# Patient Record
Sex: Male | Born: 1980 | Race: Asian | Hispanic: No | Marital: Married
Health system: Southern US, Community
[De-identification: ages and names within clinical notes are randomized; demographics above are authoritative.]

---

## 2006-08-02 ENCOUNTER — Emergency Department (HOSPITAL_COMMUNITY): Admission: EM | Admit: 2006-08-02 | Discharge: 2006-08-02 | Payer: Self-pay | Admitting: Emergency Medicine

## 2007-04-01 ENCOUNTER — Emergency Department (HOSPITAL_COMMUNITY): Admission: EM | Admit: 2007-04-01 | Discharge: 2007-04-02 | Payer: Self-pay | Admitting: Emergency Medicine

## 2009-08-31 ENCOUNTER — Emergency Department (HOSPITAL_COMMUNITY): Admission: EM | Admit: 2009-08-31 | Discharge: 2009-08-31 | Payer: Self-pay | Admitting: Emergency Medicine

## 2010-02-24 ENCOUNTER — Emergency Department (HOSPITAL_COMMUNITY)
Admission: EM | Admit: 2010-02-24 | Discharge: 2010-02-24 | Payer: Self-pay | Source: Home / Self Care | Admitting: Emergency Medicine

## 2010-02-24 LAB — URINALYSIS, ROUTINE W REFLEX MICROSCOPIC
Bilirubin Urine: NEGATIVE
Hgb urine dipstick: NEGATIVE
Ketones, ur: NEGATIVE mg/dL
Nitrite: NEGATIVE
Protein, ur: NEGATIVE mg/dL
Specific Gravity, Urine: 1.009 (ref 1.005–1.030)
Urine Glucose, Fasting: NEGATIVE mg/dL
Urobilinogen, UA: 0.2 mg/dL (ref 0.0–1.0)
pH: 6 (ref 5.0–8.0)

## 2010-10-22 LAB — I-STAT 8, (EC8 V) (CONVERTED LAB)
BUN: 7
Chloride: 110
HCT: 49
Potassium: 3.7
Sodium: 144
TCO2: 24
pCO2, Ven: 43.3 — ABNORMAL LOW

## 2010-10-22 LAB — CBC
HCT: 44.7
MCHC: 34.5
Platelets: 235
RBC: 5.27
RDW: 12.8
WBC: 13 — ABNORMAL HIGH

## 2010-10-22 LAB — POCT I-STAT CREATININE
Creatinine, Ser: 1
Operator id: 294341

## 2010-10-22 LAB — DIFFERENTIAL
Basophils Absolute: 0.1
Eosinophils Absolute: 1.7 — ABNORMAL HIGH
Eosinophils Relative: 13 — ABNORMAL HIGH
Lymphs Abs: 4.7 — ABNORMAL HIGH
Monocytes Absolute: 0.9
Neutro Abs: 5.7
Neutrophils Relative %: 44

## 2010-10-22 LAB — ETHANOL: Alcohol, Ethyl (B): 305 — ABNORMAL HIGH

## 2011-11-18 IMAGING — CT CT ABD-PELV W/O CM
2 of 7 series · 15 of 46 positions shown, 19 images · non-contrast
Comparison: None.

CLINICAL DATA: Left flank pain.

CT ABDOMEN AND PELVIS WITHOUT CONTRAST
TECHNIQUE: Multidetector CT imaging of the abdomen and pelvis was
performed following the standard protocol without intravenous
contrast.

[Series 2: a/p w/o 5.0 b31f st · axial · non-contrast · 0.76mm/px · z∈[-376,+38]mm · 12 of 93 slices shown, 16 images]
[im 5/93  soft-tissue]
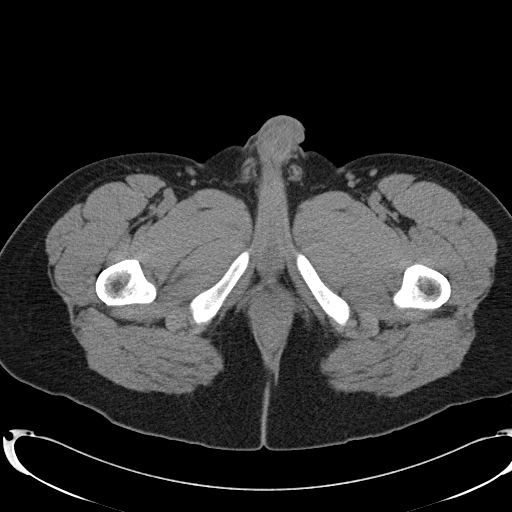
[im 5/93  bone]
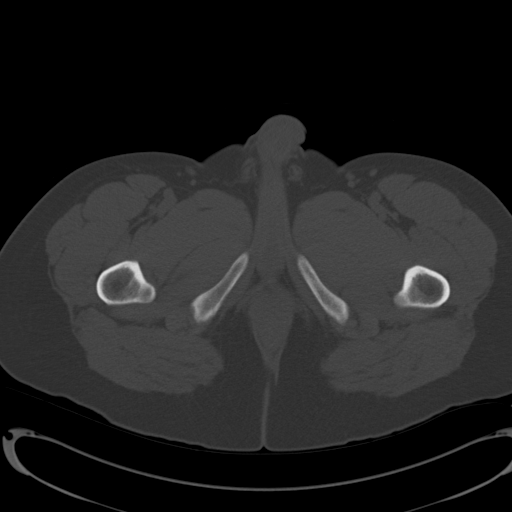
[im 15/93  soft-tissue]
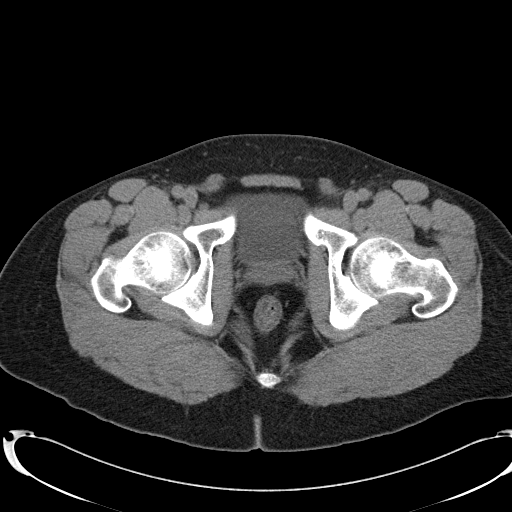
[im 25/93  soft-tissue]
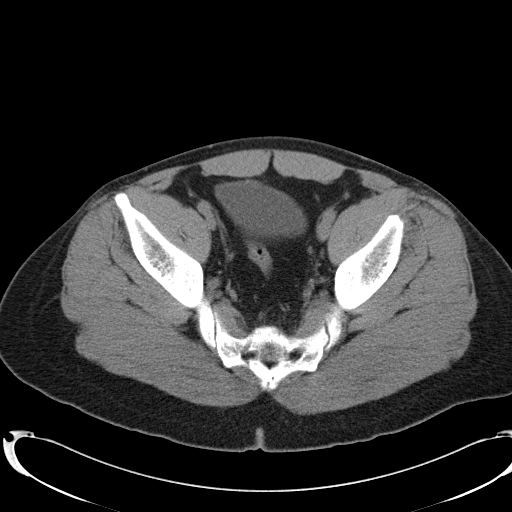
[im 34/93  soft-tissue]
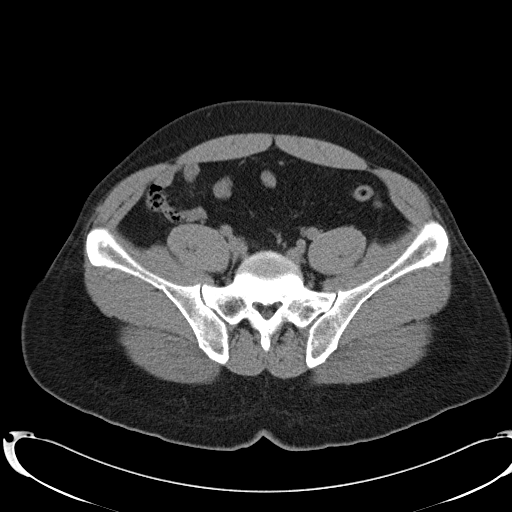
[im 44/93  soft-tissue]
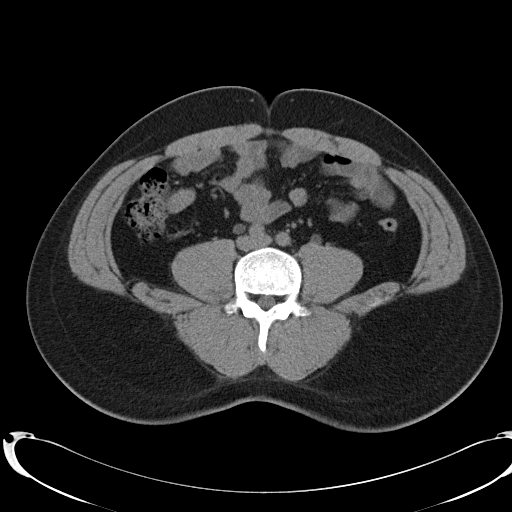
[im 49/93  soft-tissue]
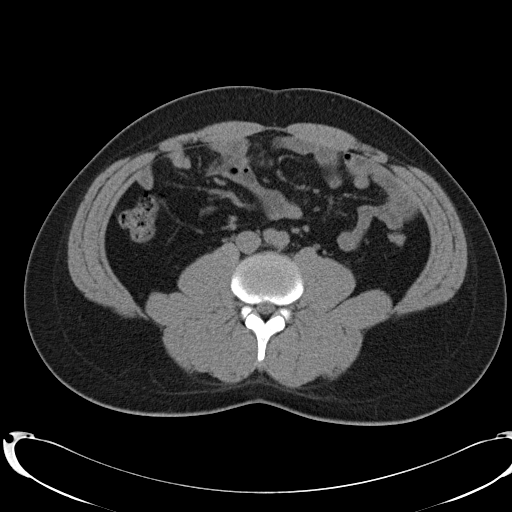
[im 59/93  soft-tissue]
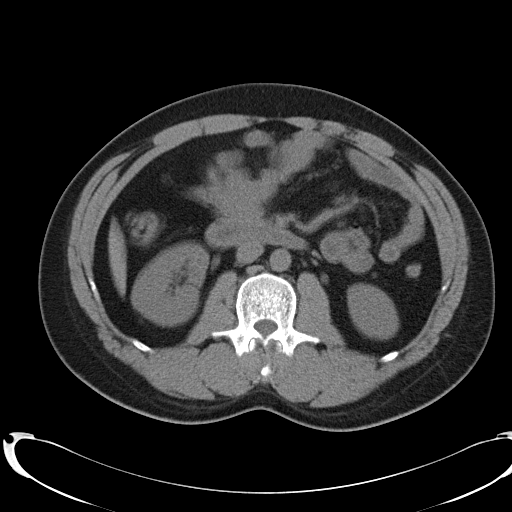
[im 68/93  soft-tissue]
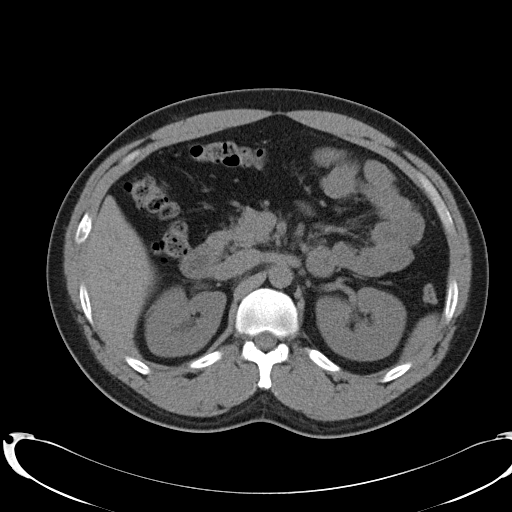
[im 73/93  lung]
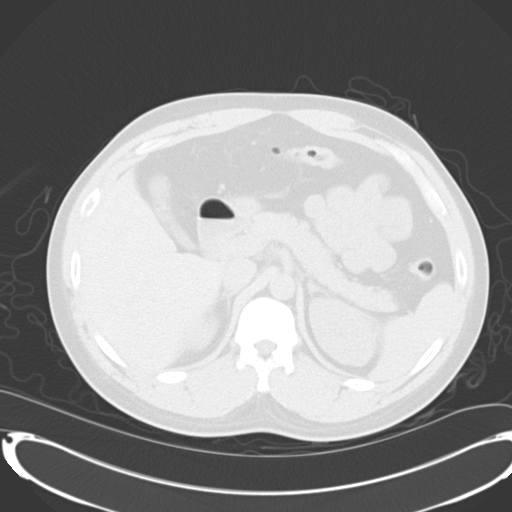
[im 78/93  soft-tissue]
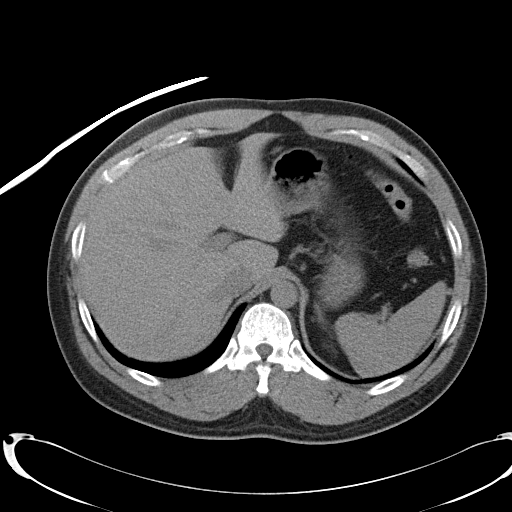
[im 78/93  lung]
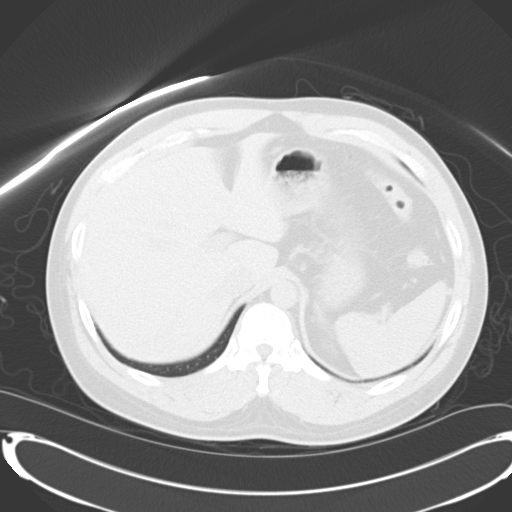
[im 78/93  bone]
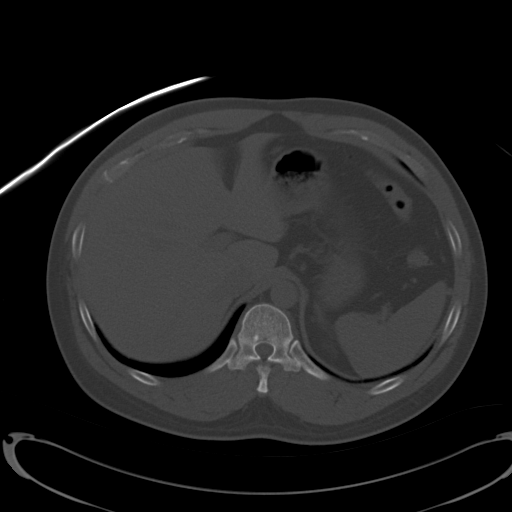
[im 83/93  lung]
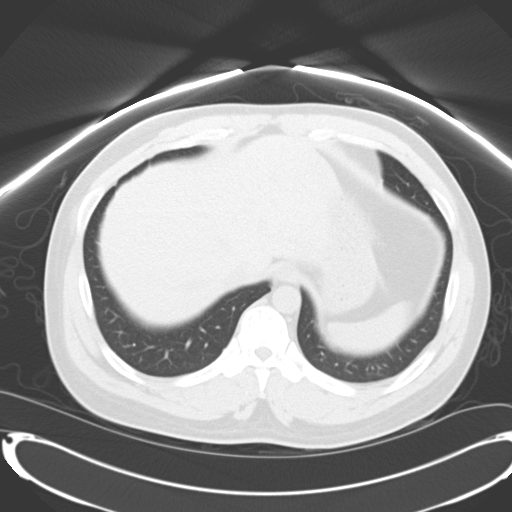
[im 88/93  soft-tissue]
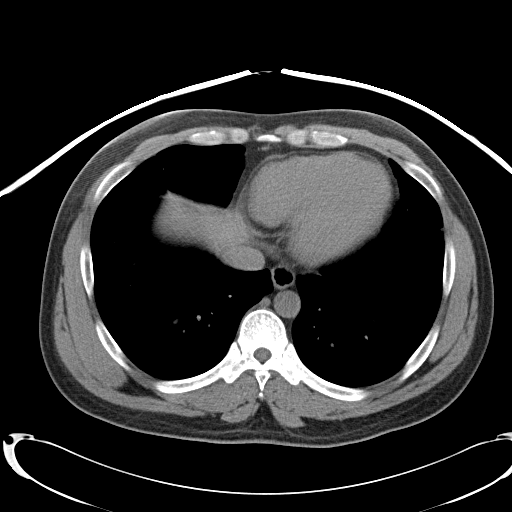
[im 88/93  lung]
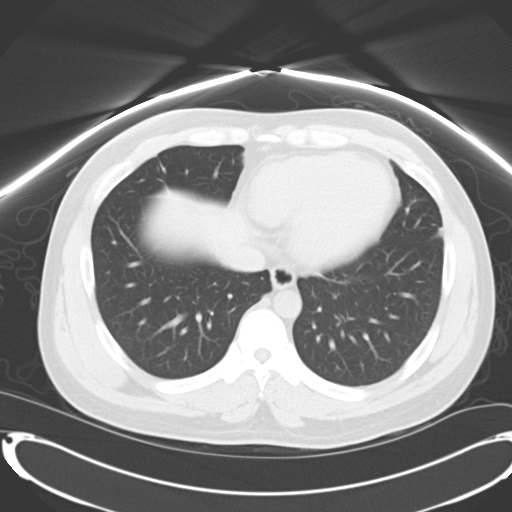

[Series 602: coronal · coronal · 0.91mm/px · 3 of 87 slices shown]
[im 22/87  soft-tissue]
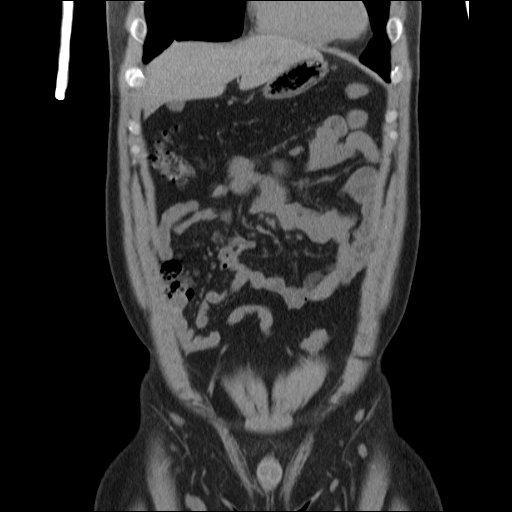
[im 44/87  soft-tissue]
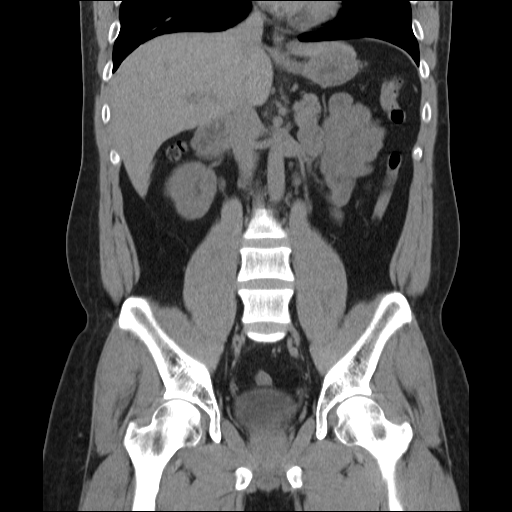
[im 65/87  soft-tissue]
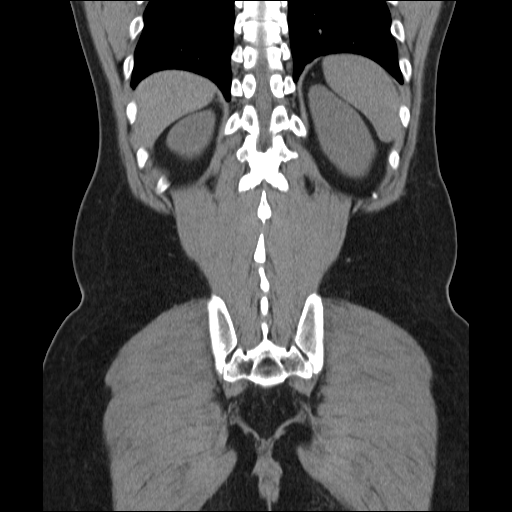

[15 of 46 positions shown; findings below may reference images not displayed]

FINDINGS: The lung bases are clear.  The heart is normal in size
without pericardial effusion.  The unenhanced appearance of the
liver, adrenal glands, spleen and pancreas is normal.  The
gallbladder is decompressed.  The kidneys are unremarkable.  No
stones are seen along the course of the ureters and there is no
hydronephrosis.  Urinary bladder is also unremarkable.  No stones
are seen in the urinary bladder to suggest recently passed stone.
The stomach is decompressed.  The appendix is seen in the right
lower quadrant without adjacent inflammatory changes.  The colon is
decompressed.  No inflammatory changes are seen.  The prostate is
normal.  There is no  acute fracture.  There is a 1.2 cm sclerotic
lesion in the spinous process of the T12 vertebral body and a
punctate area of sclerosis in the right T10 vertebral body and the
left T8 vertebral body extending into the pedicle.
IMPRESSION: No acute findings in the abdomen or pelvis.  Specifically, no renal
calculi.  Areas of sclerosis as detailed in the spine which likely
represent bone islands.  However, if there is any concern, a non
emergent bone scan may be of use for further evaluation.

## 2016-06-01 ENCOUNTER — Emergency Department (HOSPITAL_COMMUNITY)
Admission: EM | Admit: 2016-06-01 | Discharge: 2016-06-01 | Disposition: A | Discharge status: Home / Self Care | Payer: Self-pay | Attending: Dermatology | Admitting: Dermatology

## 2016-06-01 DIAGNOSIS — Z5321 Procedure and treatment not carried out due to patient leaving prior to being seen by health care provider: Secondary | ICD-10-CM | POA: Insufficient documentation

## 2016-06-01 DIAGNOSIS — H578 Other specified disorders of eye and adnexa: Secondary | ICD-10-CM | POA: Insufficient documentation

## 2016-06-01 NOTE — ED Notes (Signed)
Pt called for room no response from lobby 

## 2016-06-01 NOTE — ED Triage Notes (Signed)
Patient states he works at [employer] and the smoke has been irritating his eyes. States he began having eye pain and drainage about 3-4 weeks ago and now it is worsening. Denies any chemical exposure or trauma to eyes. Both corneas are red with eye drainage. Pt states he has pain at times, along with burning.

## 2022-07-27 ENCOUNTER — Emergency Department (HOSPITAL_COMMUNITY): Payer: Self-pay

## 2022-07-27 ENCOUNTER — Emergency Department (HOSPITAL_COMMUNITY)
Admission: EM | Admit: 2022-07-27 | Discharge: 2022-07-27 | Disposition: A | Discharge status: Home / Self Care | Payer: Self-pay | Attending: Emergency Medicine | Admitting: Emergency Medicine

## 2022-07-27 ENCOUNTER — Other Ambulatory Visit: Payer: Self-pay

## 2022-07-27 DIAGNOSIS — R0789 Other chest pain: Secondary | ICD-10-CM | POA: Insufficient documentation

## 2022-07-27 DIAGNOSIS — R079 Chest pain, unspecified: Secondary | ICD-10-CM

## 2022-07-27 LAB — CBC
HCT: 44.2 % (ref 39.0–52.0)
Hemoglobin: 15.5 g/dL (ref 13.0–17.0)
MCH: 28.5 pg (ref 26.0–34.0)
MCHC: 35.1 g/dL (ref 30.0–36.0)
MCV: 81.4 fL (ref 80.0–100.0)
Platelets: 258 10*3/uL (ref 150–400)
RBC: 5.43 MIL/uL (ref 4.22–5.81)
RDW: 12.8 % (ref 11.5–15.5)
WBC: 10 10*3/uL (ref 4.0–10.5)
nRBC: 0 % (ref 0.0–0.2)

## 2022-07-27 LAB — BASIC METABOLIC PANEL
Anion gap: 12 (ref 5–15)
BUN: 15 mg/dL (ref 6–20)
CO2: 18 mmol/L — ABNORMAL LOW (ref 22–32)
Calcium: 9 mg/dL (ref 8.9–10.3)
Chloride: 104 mmol/L (ref 98–111)
Creatinine, Ser: 1.03 mg/dL (ref 0.61–1.24)
GFR, Estimated: >60 mL/min (ref >60)
Glucose, Bld: 93 mg/dL (ref 70–99)
Potassium: 3.9 mmol/L (ref 3.5–5.1)
Sodium: 134 mmol/L — ABNORMAL LOW (ref 135–145)

## 2022-07-27 LAB — D-DIMER, QUANTITATIVE: D-Dimer, Quant: <0.27 ug/mL-FEU (ref 0.00–0.50)

## 2022-07-27 LAB — TROPONIN I (HIGH SENSITIVITY)
Troponin I (High Sensitivity): 2 ng/L (ref <18)
Troponin I (High Sensitivity): <2 ng/L (ref <18)

## 2022-07-27 NOTE — ED Triage Notes (Signed)
Pt arrived via POV. C/o R sided rib pain that began last night. No SOB. Hurts with movement

## 2022-07-27 NOTE — Discharge Instructions (Signed)
Follow-up with the primary care doctor if your symptoms are not improving.  Return to the emergency room if you have any worsening symptoms.

## 2022-07-27 NOTE — ED Provider Notes (Signed)
[place] EMERGENCY DEPARTMENT AT [facility] Provider Note   CSN: [id number] Arrival date & time: 07/27/22  1248     History  Chief Complaint  Patient presents with   Chest Pain    Kevin Velasquez is a 42 y.o. male.  Patient is a 42 year old male who presents with chest pain.  He started having some right-sided chest pain yesterday.  It was been intermittent but today has been constant.  It is worse with movement.  It is not worse with deep breathing.  No associated shortness of breath.  No cough or cold symptoms.  No leg pain or swelling.  No fevers.  No nausea vomiting or diaphoresis.  No history of recent injuries to the chest wall although he does work as a Investment banker, operational at a [employer] and feels like maybe he injured it at that point with all the movements he does.       Home Medications Prior to Admission medications   Not on File      Allergies    Patient has no allergy information on record.    Review of Systems   Review of Systems  Constitutional:  Negative for chills, diaphoresis, fatigue and fever.  HENT:  Negative for congestion, rhinorrhea and sneezing.   Eyes: Negative.   Respiratory:  Negative for cough, chest tightness and shortness of breath.   Cardiovascular:  Positive for chest pain. Negative for leg swelling.  Gastrointestinal:  Negative for abdominal pain, blood in stool, diarrhea, nausea and vomiting.  Genitourinary:  Negative for difficulty urinating, flank pain, frequency and hematuria.  Musculoskeletal:  Negative for arthralgias and back pain.  Skin:  Negative for rash.  Neurological:  Negative for dizziness, speech difficulty, weakness, numbness and headaches.    Physical Exam Updated Vital Signs BP (!) 128/95 (BP Location: Left Arm)   Pulse 94   Temp 98.3 F (36.8 C) (Oral)   Resp 18   Ht 5\' 5"  (1.651 m)   Wt 93.4 kg   SpO2 95%   BMI 34.28 kg/m  Physical Exam Constitutional:      Appearance: He is well-developed.  HENT:      Head: Normocephalic and atraumatic.  Eyes:     Pupils: Pupils are equal, round, and reactive to light.  Cardiovascular:     Rate and Rhythm: Normal rate and regular rhythm.     Heart sounds: Normal heart sounds.  Pulmonary:     Effort: Pulmonary effort is normal. No respiratory distress.     Breath sounds: Normal breath sounds. No wheezing or rales.  Chest:     Chest wall: Tenderness (Positive tenderness in the right anterior lateral lower chest wall, no crepitus or deformity, no external drainage noted.) present.  Abdominal:     General: Bowel sounds are normal.     Palpations: Abdomen is soft.     Tenderness: There is no abdominal tenderness. There is no guarding or rebound.  Musculoskeletal:        General: Normal range of motion.     Cervical back: Normal range of motion and neck supple.     Comments: No edema or calf tenderness  Lymphadenopathy:     Cervical: No cervical adenopathy.  Skin:    General: Skin is warm and dry.     Findings: No rash.  Neurological:     Mental Status: He is alert and oriented to person, place, and time.     ED Results / Procedures / Treatments   Labs (  all labs ordered are listed, but only abnormal results are displayed) Labs Reviewed  BASIC METABOLIC PANEL - Abnormal; Notable for the following components:      Result Value   Sodium 134 (*)    CO2 18 (*)    All other components within normal limits  CBC  D-DIMER, QUANTITATIVE  TROPONIN I (HIGH SENSITIVITY)  TROPONIN I (HIGH SENSITIVITY)    EKG EKG Interpretation Date/Time:  Saturday July 27 2022 13:09:14 EDT Ventricular Rate:  87 PR Interval:  139 QRS Duration:  88 QT Interval:  354 QTC Calculation: 426 R Axis:   85  Text Interpretation: Sinus rhythm ST elev, probable normal early repol pattern since last tracing no significant change Confirmed by Rolan Bucco [phone]) on 07/27/2022 2:05:11 PM  Radiology DG Chest 2 View  Result Date: 07/27/2022 CLINICAL DATA:  Right chest  pain EXAM: CHEST - 2 VIEW COMPARISON:  08/31/2009 FINDINGS: The heart size and mediastinal contours are within normal limits. Both lungs are clear. The visualized skeletal structures are unremarkable. IMPRESSION: No active cardiopulmonary disease. Electronically Signed   By: Ernie Avena M.D.   On: 07/27/2022 14:00    Procedures Procedures    Medications Ordered in ED Medications - No data to display  ED Course/ Medical Decision Making/ A&P                             Medical Decision Making Amount and/or Complexity of Data Reviewed Labs: ordered. Radiology: ordered.   Patient is a 42 year old male who presents with right-sided chest pain.  It is reproducible with palpation of the chest wall.  No other associated symptoms.  No exertional symptoms.  No pleuritic symptoms.  EKG does not show any ischemic changes.  He had 2 negative troponins.  D-dimer is negative.  He does not have other symptoms that would be more Suggestive of PE.  Chest x-ray two-view was performed.  This was interpreted by me and confirmed by the radiologist.  There is no evidence of pneumonia.  No pneumothorax.  He denies any for any pain medication.  He was discharged home in good condition.  I suspect this is musculoskeletal in origin.  He was given symptomatic care instructions.  He was encouraged to follow-up with a primary care physician if his symptoms are not improving.  Return precautions were given.  Final Clinical Impression(s) / ED Diagnoses Final diagnoses:  Nonspecific chest pain    Rx / DC Orders ED Discharge Orders     None         Rolan Bucco, MD 07/27/22 1552

## 2023-02-05 ENCOUNTER — Emergency Department (HOSPITAL_COMMUNITY): Payer: Self-pay

## 2023-02-05 ENCOUNTER — Emergency Department (HOSPITAL_COMMUNITY)
Admission: EM | Admit: 2023-02-05 | Discharge: 2023-02-05 | Disposition: A | Discharge status: Home / Self Care | Payer: Self-pay | Attending: Emergency Medicine | Admitting: Emergency Medicine

## 2023-02-05 ENCOUNTER — Other Ambulatory Visit: Payer: Self-pay

## 2023-02-05 ENCOUNTER — Encounter (HOSPITAL_COMMUNITY): Payer: Self-pay

## 2023-02-05 DIAGNOSIS — Y92014 Private driveway to single-family (private) house as the place of occurrence of the external cause: Secondary | ICD-10-CM | POA: Insufficient documentation

## 2023-02-05 DIAGNOSIS — R0789 Other chest pain: Secondary | ICD-10-CM | POA: Insufficient documentation

## 2023-02-05 DIAGNOSIS — W002XXA Other fall from one level to another due to ice and snow, initial encounter: Secondary | ICD-10-CM | POA: Insufficient documentation

## 2023-02-05 DIAGNOSIS — Y9301 Activity, walking, marching and hiking: Secondary | ICD-10-CM | POA: Insufficient documentation

## 2023-02-05 MED ORDER — IBUPROFEN 800 MG PO TABS
800.0000 mg | ORAL_TABLET | Freq: Once | ORAL | Status: AC
  Administered 2023-02-05: 800 mg via ORAL
  Filled 2023-02-05: qty 1

## 2023-02-05 NOTE — Discharge Instructions (Signed)
 Evaluation today was overall reassuring.  Suspect he may have some bruising in the skin and connective tissue around the ribs that may be causing her symptoms.  Recommend ibuprofen  and Tylenol and applying ice 3-4 times a day.  Also please follow-up your PCP.  If you develop any shortness of breath, worsening chest pain or any other concerning symptom please return emergency department further evaluation.

## 2023-02-05 NOTE — ED Triage Notes (Signed)
 Pt slipped on ice yesterday morning and landed on his left side. Pt complaining of left rib pain. Pt denies LOC; denies any other injuries and/or complaint.

## 2023-02-05 NOTE — ED Provider Notes (Signed)
 [place] EMERGENCY DEPARTMENT AT [facility] Provider Note   CSN: [id number] Arrival date & time: 02/05/23  0433     History  Chief Complaint  Patient presents with   Fall   HPI Kevin Velasquez is a 43 y.o. male presenting for fall.  States he was walking in his driveway yesterday slipped on the ice and landed on his left side.  Now having persistent left rib pain.  Denies shortness of breath.  Denies abdominal pain.  The pain is sharp and constant.  It is nonradiating and nonpleuritic.   Fall       Home Medications Prior to Admission medications   Not on File      Allergies    Patient has no known allergies.    Review of Systems   See HPI for pertinent positives  Physical Exam Updated Vital Signs BP (!) 148/107 (BP Location: Left Wrist)   Pulse 69   Temp 97.8 F (36.6 C) (Oral)   Resp 18   SpO2 94%  Physical Exam Vitals and nursing note reviewed.  HENT:     Head: Normocephalic and atraumatic.     Mouth/Throat:     Mouth: Mucous membranes are moist.  Eyes:     General:        Right eye: No discharge.        Left eye: No discharge.     Conjunctiva/sclera: Conjunctivae normal.  Cardiovascular:     Rate and Rhythm: Normal rate and regular rhythm.     Pulses: Normal pulses.     Heart sounds: Normal heart sounds.  Pulmonary:     Effort: Pulmonary effort is normal.     Breath sounds: Normal breath sounds and air entry.  Chest:     Comments: Appears atraumatic.  There was some tenderness with palpation to the left lateral chest wall.  No step-offs, no crepitus, no deformity. Abdominal:     General: Abdomen is flat.     Palpations: Abdomen is soft.  Skin:    General: Skin is warm and dry.  Neurological:     General: No focal deficit present.  Psychiatric:        Mood and Affect: Mood normal.     ED Results / Procedures / Treatments   Labs (all labs ordered are listed, but only abnormal results are displayed) Labs Reviewed - No data to  display  EKG None  Radiology DG Ribs Unilateral W/Chest Left Result Date: 02/05/2023 CLINICAL DATA:  Patient slipped on ice and fell injuring left side. EXAM: LEFT RIBS AND CHEST - 3+ VIEW COMPARISON:  None Available. FINDINGS: No fracture or other bone lesions are seen involving the ribs. There is no evidence of pneumothorax or pleural effusion. Both lungs are clear. Heart size and mediastinal contours are within normal limits. IMPRESSION: Negative. Electronically Signed   By: Camellia Candle M.D.   On: 02/05/2023 07:02    Procedures Procedures    Medications Ordered in ED Medications  ibuprofen  (ADVIL ) tablet 800 mg (has no administration in time range)    ED Course/ Medical Decision Making/ A&P                                 Medical Decision Making Amount and/or Complexity of Data Reviewed Radiology: ordered.   43 year old well-appearing male presenting for left-sided rib pain after a fall yesterday.  Exam notable for left lateral chest wall tenderness but  otherwise reassuring.  DDx includes traumatic pneumothorax, ACS, pulmonary contusion, rib fracture, other.  I personally reviewed and interpreted x-ray which was negative for acute injuries.  Suspect MSK pain.  Advised treatment with NSAIDs, Tylenol and applying ice.  Advised him to follow-up PCP.  Discussed pertinent return precautions.  Vital stable.  Discharged in good condition.        Final Clinical Impression(s) / ED Diagnoses Final diagnoses:  Chest wall tenderness    Rx / DC Orders ED Discharge Orders     None         Lang Norleen POUR, PA-C 02/05/23 0800    Charlyn Sora, MD 02/07/23 1416

## 2023-02-24 ENCOUNTER — Inpatient Hospital Stay (HOSPITAL_BASED_OUTPATIENT_CLINIC_OR_DEPARTMENT_OTHER): Payer: Self-pay | Admitting: Family Medicine

## 2023-03-11 ENCOUNTER — Ambulatory Visit (HOSPITAL_BASED_OUTPATIENT_CLINIC_OR_DEPARTMENT_OTHER): Payer: Self-pay | Admitting: Family Medicine

## 2023-04-18 ENCOUNTER — Ambulatory Visit (HOSPITAL_BASED_OUTPATIENT_CLINIC_OR_DEPARTMENT_OTHER): Payer: Self-pay | Admitting: Family Medicine
